# Patient Record
Sex: Female | Born: 1960 | Race: White | Hispanic: No | Smoking: Current some day smoker
Health system: Southern US, Community
[De-identification: ages and names within clinical notes are randomized; demographics above are authoritative.]

## PROBLEM LIST (undated history)

## (undated) DIAGNOSIS — J40 Bronchitis, not specified as acute or chronic: Secondary | ICD-10-CM

## (undated) HISTORY — PX: CHOLECYSTECTOMY: SHX55

---

## 2016-09-01 ENCOUNTER — Emergency Department (HOSPITAL_COMMUNITY): Payer: 59

## 2016-09-01 ENCOUNTER — Encounter (HOSPITAL_COMMUNITY): Payer: Self-pay | Admitting: Emergency Medicine

## 2016-09-01 ENCOUNTER — Emergency Department (HOSPITAL_COMMUNITY)
Admission: EM | Admit: 2016-09-01 | Discharge: 2016-09-01 | Disposition: A | Discharge status: Home / Self Care | Payer: 59 | Attending: Emergency Medicine | Admitting: Emergency Medicine

## 2016-09-01 DIAGNOSIS — Z79899 Other long term (current) drug therapy: Secondary | ICD-10-CM | POA: Diagnosis not present

## 2016-09-01 DIAGNOSIS — J441 Chronic obstructive pulmonary disease with (acute) exacerbation: Secondary | ICD-10-CM | POA: Diagnosis not present

## 2016-09-01 DIAGNOSIS — R9431 Abnormal electrocardiogram [ECG] [EKG]: Secondary | ICD-10-CM | POA: Insufficient documentation

## 2016-09-01 DIAGNOSIS — F17200 Nicotine dependence, unspecified, uncomplicated: Secondary | ICD-10-CM | POA: Diagnosis not present

## 2016-09-01 DIAGNOSIS — R0602 Shortness of breath: Secondary | ICD-10-CM | POA: Diagnosis present

## 2016-09-01 HISTORY — DX: Bronchitis, not specified as acute or chronic: J40

## 2016-09-01 LAB — CBC WITH DIFFERENTIAL/PLATELET
Basophils Absolute: 0 10*3/uL (ref 0.0–0.1)
Basophils Relative: 0 %
EOS PCT: 0 %
Eosinophils Absolute: 0 10*3/uL (ref 0.0–0.7)
HCT: 47.6 % — ABNORMAL HIGH (ref 36.0–46.0)
Hemoglobin: 16.6 g/dL — ABNORMAL HIGH (ref 12.0–15.0)
LYMPHS ABS: 1.7 10*3/uL (ref 0.7–4.0)
LYMPHS PCT: 28 %
MCH: 32.4 pg (ref 26.0–34.0)
MCHC: 34.9 g/dL (ref 30.0–36.0)
MCV: 92.8 fL (ref 78.0–100.0)
MONO ABS: 0.7 10*3/uL (ref 0.1–1.0)
MONOS PCT: 11 %
Neutro Abs: 3.7 10*3/uL (ref 1.7–7.7)
Neutrophils Relative %: 61 %
PLATELETS: 183 10*3/uL (ref 150–400)
RBC: 5.13 MIL/uL — AB (ref 3.87–5.11)
RDW: 13 % (ref 11.5–15.5)
WBC: 6.1 10*3/uL (ref 4.0–10.5)

## 2016-09-01 LAB — BASIC METABOLIC PANEL
Anion gap: 8 (ref 5–15)
BUN: 11 mg/dL (ref 6–20)
CO2: 27 mmol/L (ref 22–32)
Calcium: 9.2 mg/dL (ref 8.9–10.3)
Chloride: 102 mmol/L (ref 101–111)
Creatinine, Ser: 0.9 mg/dL (ref 0.44–1.00)
GFR calc Af Amer: >60 mL/min (ref >60)
GLUCOSE: 90 mg/dL (ref 65–99)
POTASSIUM: 3.8 mmol/L (ref 3.5–5.1)
Sodium: 137 mmol/L (ref 135–145)

## 2016-09-01 LAB — D-DIMER, QUANTITATIVE: D-Dimer, Quant: 1.03 ug/mL-FEU — ABNORMAL HIGH (ref 0.00–0.50)

## 2016-09-01 LAB — TROPONIN I: Troponin I: <0.03 ng/mL (ref <0.03)

## 2016-09-01 MED ORDER — IPRATROPIUM BROMIDE 0.02 % IN SOLN
1.0000 mg | Freq: Once | RESPIRATORY_TRACT | Status: AC
  Administered 2016-09-01: 1 mg via RESPIRATORY_TRACT
  Filled 2016-09-01: qty 5

## 2016-09-01 MED ORDER — ALBUTEROL (5 MG/ML) CONTINUOUS INHALATION SOLN
10.0000 mg/h | INHALATION_SOLUTION | Freq: Once | RESPIRATORY_TRACT | Status: AC
  Administered 2016-09-01: 10 mg/h via RESPIRATORY_TRACT
  Filled 2016-09-01: qty 20

## 2016-09-01 MED ORDER — PREDNISONE 20 MG PO TABS: 40.0000 mg | ORAL_TABLET | Freq: Every day | ORAL | 0 refills | Status: DC

## 2016-09-01 MED ORDER — METHYLPREDNISOLONE SODIUM SUCC 125 MG IJ SOLR
125.0000 mg | Freq: Once | INTRAMUSCULAR | Status: AC
  Administered 2016-09-01: 125 mg via INTRAVENOUS
  Filled 2016-09-01: qty 2

## 2016-09-01 MED ORDER — IOPAMIDOL (ISOVUE-370) INJECTION 76%
100.0000 mL | Freq: Once | INTRAVENOUS | Status: AC | PRN
  Administered 2016-09-01: 100 mL via INTRAVENOUS

## 2016-09-01 NOTE — ED Provider Notes (Signed)
[facility]Y DEPT Provider Note   CSN: [id number] Arrival date & time: 09/01/16  1535     History   Chief Complaint Chief Complaint  Patient presents with  . Shortness of Breath    HPI Cindy Griffith is a 55 y.o. female.  HPI  Pt was seen at 1630. Per pt, c/o gradual onset and worsening of persistent SOB and cough for the past 1 week. Pt states her PMD was "treating me for pneumonia" but "I wasn't getting better," so pt was sent to the ED for further evaluation. Denies fevers, no rash, no CP/palpitations, no back pain, no abd pain, no N/V/D.    Past Medical History:  Diagnosis Date  . Bronchitis     There are no active problems to display for this patient.   Past Surgical History:  Procedure Laterality Date  . CESAREAN SECTION    . CHOLECYSTECTOMY      OB History    No data available       Home Medications    Prior to Admission medications   Not on File    Family History History reviewed. No pertinent family history.  Social History Social History  Substance Use Topics  . Smoking status: Current Every Day Smoker  . Smokeless tobacco: Never Used  . Alcohol use No     Allergies   Codeine   Review of Systems Review of Systems ROS: Statement: All systems negative except as marked or noted in the HPI; Constitutional: Negative for fever and chills. ; ; Eyes: Negative for eye pain, redness and discharge. ; ; ENMT: Negative for ear pain, hoarseness, nasal congestion, sinus pressure and sore throat. ; ; Cardiovascular: Negative for chest pain, palpitations, diaphoresis, and peripheral edema. ; ; Respiratory: +cough, SOB. Negative for wheezing and stridor. ; ; Gastrointestinal: Negative for nausea, vomiting, diarrhea, abdominal pain, blood in stool, hematemesis, jaundice and rectal bleeding. . ; ; Genitourinary: Negative for dysuria, flank pain and hematuria. ; ; Musculoskeletal: Negative for back pain and neck pain. Negative for swelling and trauma.; ; Skin:  Negative for pruritus, rash, abrasions, blisters, bruising and skin lesion.; ; Neuro: Negative for headache, lightheadedness and neck stiffness. Negative for weakness, altered level of consciousness, altered mental status, extremity weakness, paresthesias, involuntary movement, seizure and syncope.     Physical Exam Updated Vital Signs BP (!) 159/115 (BP Location: Left Arm)   Pulse 87   Temp 98.2 F (36.8 C) (Oral)   Resp 22   Ht 5\' 2"  (1.575 m)   Wt 203 lb (92.1 kg)   SpO2 97%   BMI 37.13 kg/m   Physical Exam 1635: Physical examination:  Nursing notes reviewed; Vital signs and O2 SAT reviewed;  Constitutional: Well developed, Well nourished, Well hydrated, In no acute distress; Head:  Normocephalic, atraumatic; Eyes: EOMI, PERRL, No scleral icterus; ENMT: Mouth and pharynx normal, Mucous membranes moist; Neck: Supple, Full range of motion, No lymphadenopathy; Cardiovascular: Regular rate and rhythm, No gallop; Respiratory: Breath sounds coarse & equal bilaterally, insp/exp wheezes. No audible wheezing. Speaking full sentences with ease, Normal respiratory effort/excursion; Chest: Nontender, Movement normal; Abdomen: Soft, Nontender, Nondistended, Normal bowel sounds; Genitourinary: No CVA tenderness; Extremities: Pulses normal, No tenderness, No edema, No calf edema or asymmetry.; Neuro: AA&Ox3, Major CN grossly intact.  Speech clear. No gross focal motor or sensory deficits in extremities.; Skin: Color normal, Warm, Dry.   ED Treatments / Results  Labs (all labs ordered are listed, but only abnormal results are displayed)   EKG  EKG Interpretation  Date/Time:  Monday September 01 2016 15:46:48 EDT Ventricular Rate:  86 PR Interval:  118 QRS Duration: 72 QT Interval:  366 QTC Calculation: 437 R Axis:   50 Text Interpretation:  Normal sinus rhythm ST & T wave abnormality, consider inferior ischemia No old tracing to compare Confirmed by [facility]  MD, Nicholos JohnsKATHLEEN [phone] on 09/01/2016  4:44:38 PM       Radiology   Procedures Procedures (including critical care time)  Medications Ordered in ED Medications  albuterol (PROVENTIL,VENTOLIN) solution continuous neb (not administered)  ipratropium (ATROVENT) nebulizer solution 1 mg (not administered)  methylPREDNISolone sodium succinate (SOLU-MEDROL) 125 mg/2 mL injection 125 mg (125 mg Intravenous Given 09/01/16 1650)     Initial Impression / Assessment and Plan / ED Course  I have reviewed the triage vital signs and the nursing notes.  Pertinent labs & imaging results that were available during my care of the patient were reviewed by me and considered in my medical decision making (see chart for details).  MDM Reviewed: nursing note and vitals Interpretation: labs, ECG and x-ray   Results for orders placed or performed during the hospital encounter of 09/01/16  CBC with Differential  Result Value Ref Range   WBC 6.1 4.0 - 10.5 K/uL   RBC 5.13 (H) 3.87 - 5.11 MIL/uL   Hemoglobin 16.6 (H) 12.0 - 15.0 g/dL   HCT 60.447.6 (H) 54.036.0 - 98.146.0 %   MCV 92.8 78.0 - 100.0 fL   MCH 32.4 26.0 - 34.0 pg   MCHC 34.9 30.0 - 36.0 g/dL   RDW 19.113.0 47.811.5 - 29.515.5 %   Platelets 183 150 - 400 K/uL   Neutrophils Relative % 61 %   Neutro Abs 3.7 1.7 - 7.7 K/uL   Lymphocytes Relative 28 %   Lymphs Abs 1.7 0.7 - 4.0 K/uL   Monocytes Relative 11 %   Monocytes Absolute 0.7 0.1 - 1.0 K/uL   Eosinophils Relative 0 %   Eosinophils Absolute 0.0 0.0 - 0.7 K/uL   Basophils Relative 0 %   Basophils Absolute 0.0 0.0 - 0.1 K/uL  Basic metabolic panel  Result Value Ref Range   Sodium 137 135 - 145 mmol/L   Potassium 3.8 3.5 - 5.1 mmol/L   Chloride 102 101 - 111 mmol/L   CO2 27 22 - 32 mmol/L   Glucose, Bld 90 65 - 99 mg/dL   BUN 11 6 - 20 mg/dL   Creatinine, Ser 6.210.90 0.44 - 1.00 mg/dL   Calcium 9.2 8.9 - 30.810.3 mg/dL   GFR calc non Af Amer >60 >60 mL/min   GFR calc Af Amer >60 >60 mL/min   Anion gap 8 5 - 15  Troponin I  Result Value  Ref Range   Troponin I <0.03 <0.03 ng/mL  D-dimer, quantitative  Result Value Ref Range   D-Dimer, Quant 1.03 (H) 0.00 - 0.50 ug/mL-FEU   Dg Chest 2 View Result Date: 09/01/2016 CLINICAL DATA:  Shortness of breath and productive cough for 1 week EXAM: CHEST  2 VIEW COMPARISON:  None. FINDINGS: The heart size and mediastinal contours are within normal limits. Both lungs are clear. The visualized skeletal structures are unremarkable. IMPRESSION: No active cardiopulmonary disease. Electronically Signed   By: Alcide CleverMark  Lukens M.D.   On: 09/01/2016 16:07   Ct Angio Chest Pe W/cm &/or Wo Cm Result Date: 09/01/2016 CLINICAL DATA:  Shortness of breath for 1 week.  Elevated D-dimer. EXAM: CT ANGIOGRAPHY CHEST WITH CONTRAST TECHNIQUE: Multidetector CT  imaging of the chest was performed using the standard protocol during bolus administration of intravenous contrast. Multiplanar CT image reconstructions and MIPs were obtained to evaluate the vascular anatomy. CONTRAST:  100 mL Isovue 370 COMPARISON:  Chest radiographs earlier today FINDINGS: Cardiovascular: Pulmonary arterial opacification is adequate without evidence of lobar or more central embolus. Evaluation of segmental and subsegmental branches is limited by respiratory motion artifact, particularly in the lower lobes. There is mild thoracic aortic atherosclerosis without aneurysm. The heart is normal in size. Mediastinum/Nodes: No enlarged axillary, mediastinal, or hilar lymph nodes are identified. Thyroid, trachea, and esophagus are unremarkable. Lungs/Pleura: No pleural effusion or pneumothorax. Mild centrilobular emphysema. No lung consolidation or mass. Upper Abdomen: Prior cholecystectomy. 3 cm diverticulum arising from the posterior gastric fundus. 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 Review of the MIP images confirms the above findings. IMPRESSION: 1. No central pulmonary embolus. Limited segmental and subsegmental  evaluation due to respiratory motion artifact. 2. Centrilobular emphysema. 3. Gastric diverticulum. Electronically Signed   By: Sebastian Ache M.D.   On: 09/01/2016 19:17    1940:  Pt states she "feels better" after neb and steroid.  NAD, lungs CTA bilat, no wheezing, resps easy, speaking full sentences, Sats 100% R/A.  Pt ambulated around the ED with Sats remaining 98 % R/A, resps easy, NAD.  Wants to go home now. Denies CP; will need f/u with Cards for abnl EKG. Tx COPD exacerbation (already taking abx). Dx and testing d/w pt and family.  Questions answered.  Verb understanding, agreeable to d/c home with outpt f/u.    Final Clinical Impressions(s) / ED Diagnoses   Final diagnoses:  None    New Prescriptions New Prescriptions   No medications on file     Samuel Jester, DO 09/03/16 2104

## 2016-09-01 NOTE — ED Notes (Signed)
EKG given to Dr Tegeler. 

## 2016-09-01 NOTE — ED Triage Notes (Signed)
[facility] for complaint of shortness of breath x 1 week. States she was treated for possible pneumonia and given antibiotics but is no better.

## 2016-09-01 NOTE — ED Notes (Signed)
    Patient Saturations on Room Air while Ambulating = 98%   

## 2016-09-01 NOTE — Discharge Instructions (Signed)
Take the prescription as directed.  Use your albuterol inhaler (2 to 4 puffs) every 4 hours for the next 7 days, then as needed for cough, wheezing, or shortness of breath.  Call your regular medical doctor tomorrow morning to schedule a follow up appointment within the next 2 days.  Call the Cardiologist tomorrow to schedule a follow up appointment within the next week. Return to the Emergency Department immediately sooner if worsening.

## 2017-02-21 IMAGING — CT CT ANGIO CHEST
2 of 6 series · 18 of 46 positions shown · IV contrast (APPLIED)
Comparison: Chest radiographs earlier today

CLINICAL DATA: Shortness of breath for 1 week.  Elevated D-dimer.

EXAM:
CT ANGIOGRAPHY CHEST WITH CONTRAST
TECHNIQUE: Multidetector CT imaging of the chest was performed using the
standard protocol during bolus administration of intravenous
contrast. Multiplanar CT image reconstructions and MIPs were
obtained to evaluate the vascular anatomy.
CONTRAST:  100 mL Isovue 370

[Series 5: thins · axial · 0.72mm/px · z∈[+1280,+1529]mm · 15 of 273 slices shown]
[im 12/273  lung]
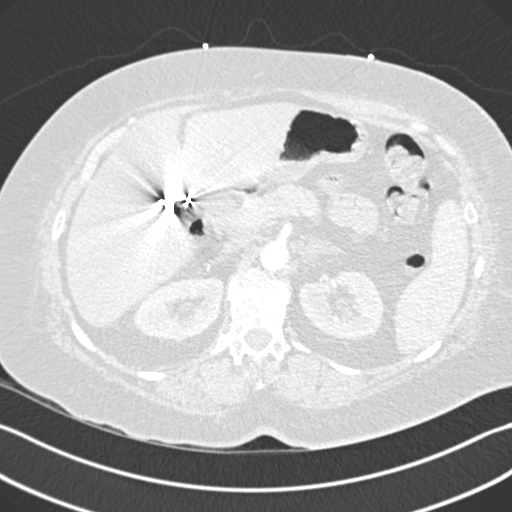
[im 36/273  soft-tissue]
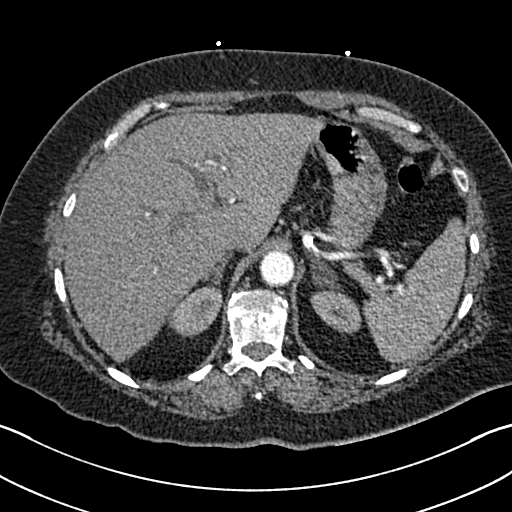
[im 48/273  lung]
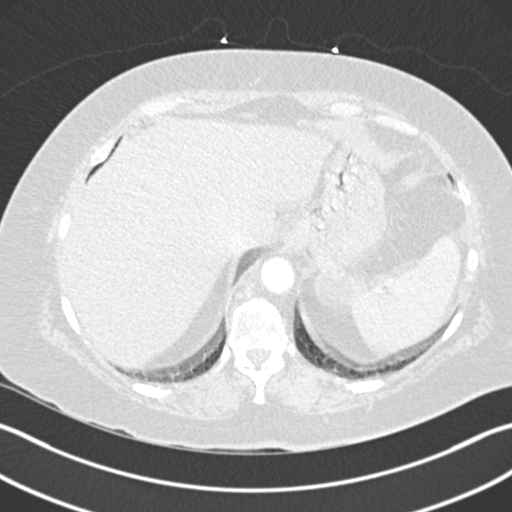
[im 71/273  soft-tissue]
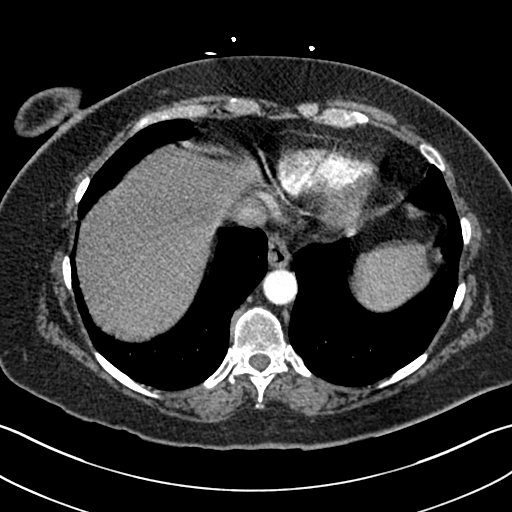
[im 83/273  lung]
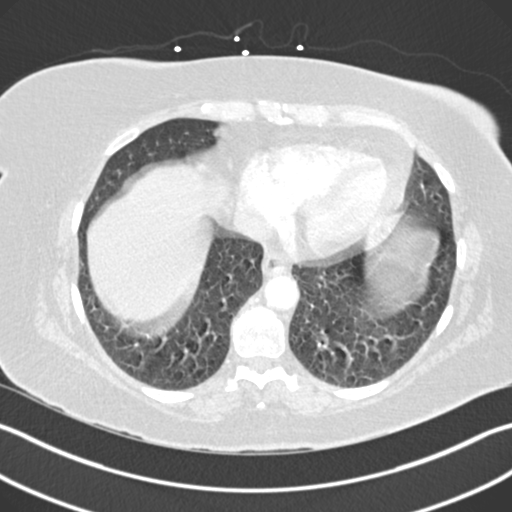
[im 107/273  soft-tissue]
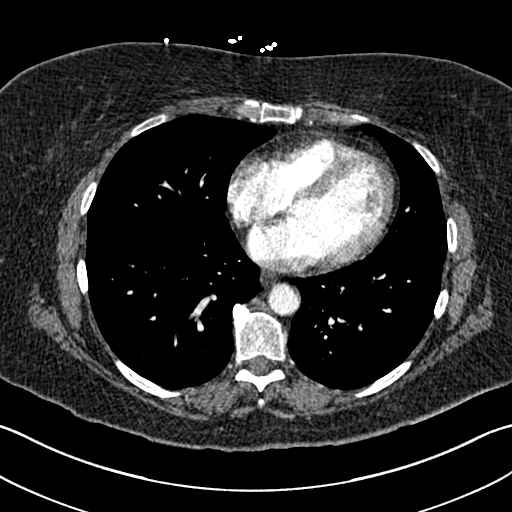
[im 119/273  lung]
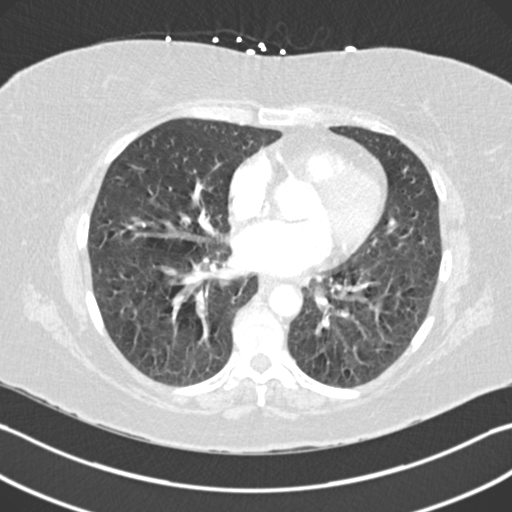
[im 142/273  soft-tissue]
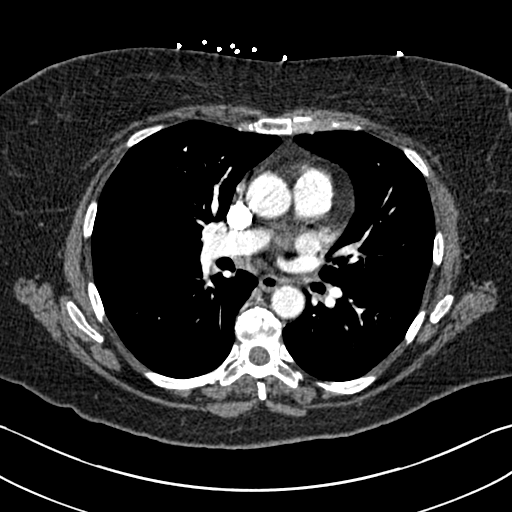
[im 154/273  lung]
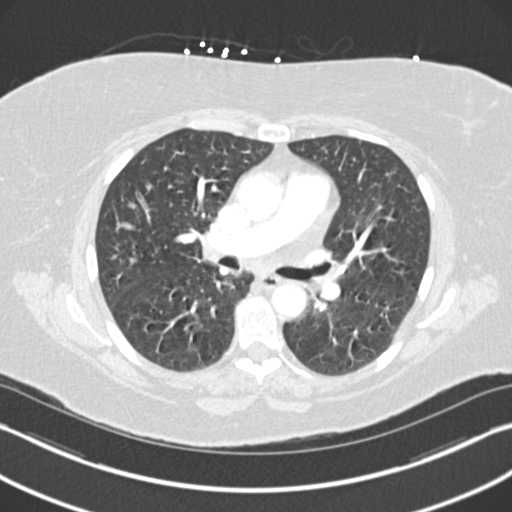
[im 166/273  soft-tissue]
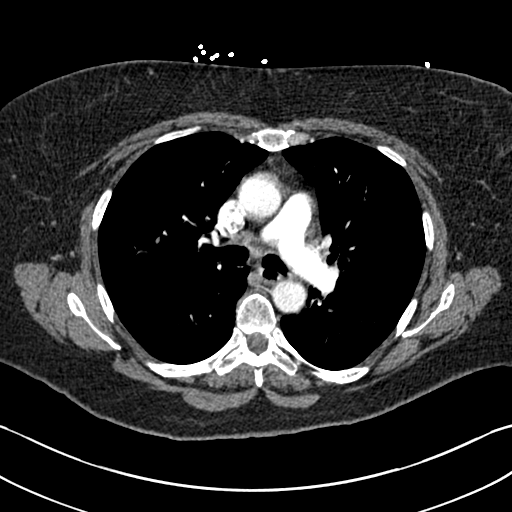
[im 190/273  lung]
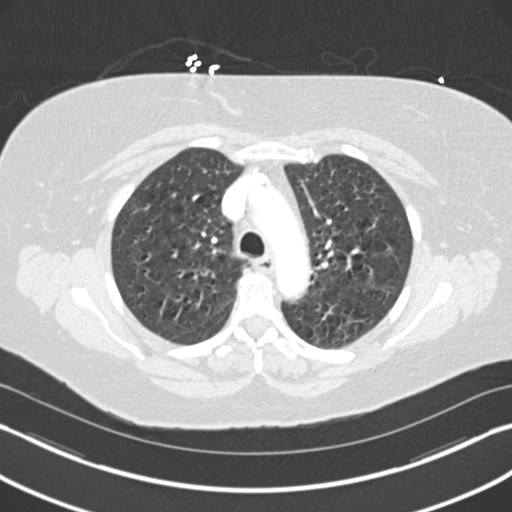
[im 202/273  soft-tissue]
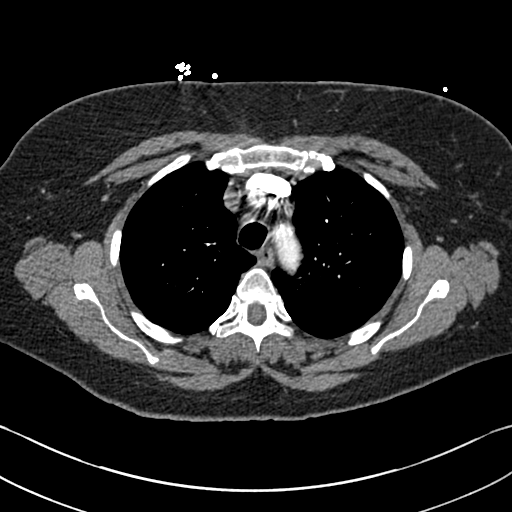
[im 225/273  lung]
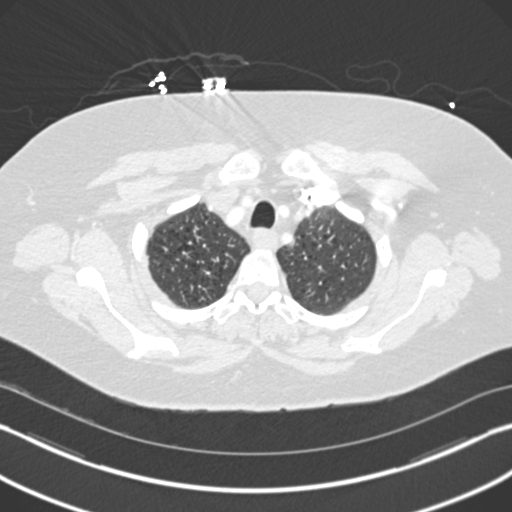
[im 237/273  soft-tissue]
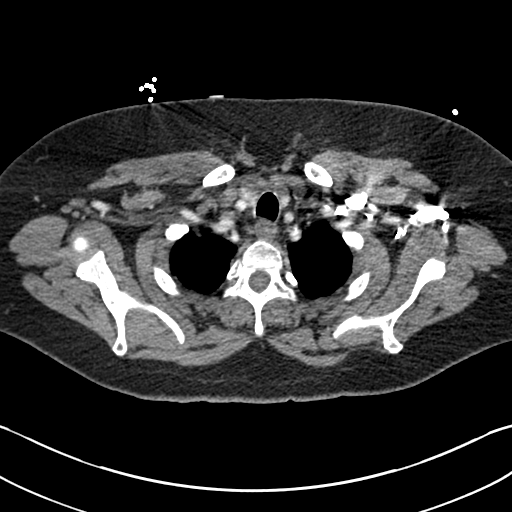
[im 261/273  lung]
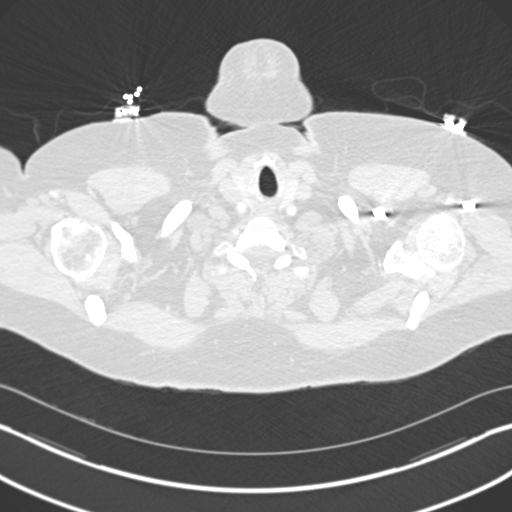

[Series 7: coronal mpr · coronal · 0.56mm/px · 3 of 151 slices shown]
[im 38/151  soft-tissue]
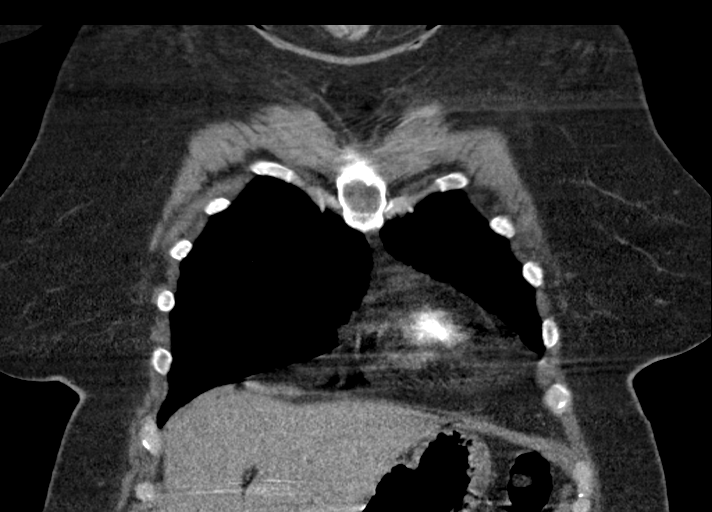
[im 76/151  soft-tissue]
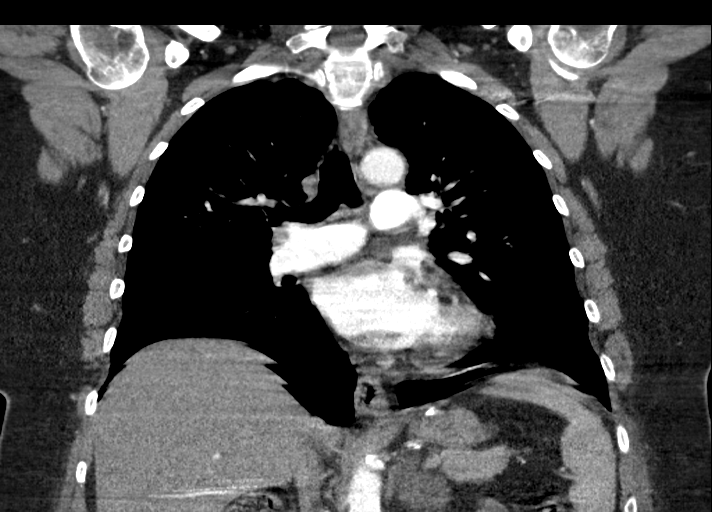
[im 113/151  soft-tissue]
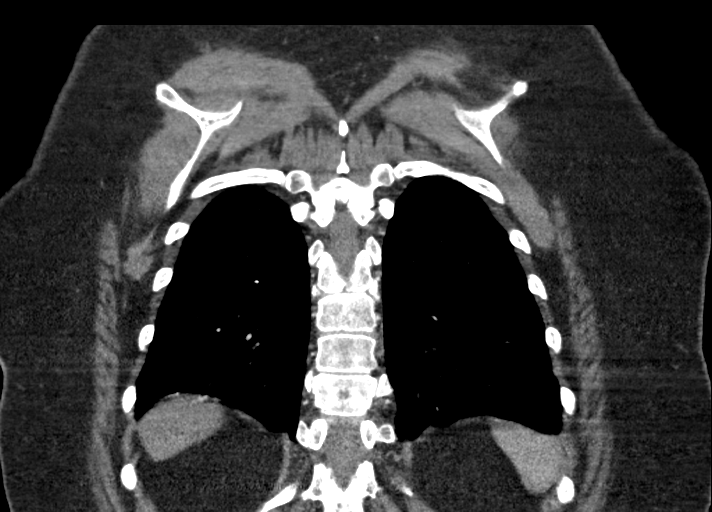

[18 of 46 positions shown; findings below may reference images not displayed]

FINDINGS: Cardiovascular: Pulmonary arterial opacification is adequate without
evidence of lobar or more central embolus. Evaluation of segmental
and subsegmental branches is limited by respiratory motion artifact,
particularly in the lower lobes. There is mild thoracic aortic
atherosclerosis without aneurysm. The heart is normal in size.

Mediastinum/Nodes: No enlarged axillary, mediastinal, or hilar lymph
nodes are identified. Thyroid, trachea, and esophagus are
unremarkable.

Lungs/Pleura: No pleural effusion or pneumothorax. Mild
centrilobular emphysema. No lung consolidation or mass.

Upper Abdomen: Prior cholecystectomy. 3 cm diverticulum arising from
the posterior gastric fundus.

Musculoskeletal: Multilevel thoracic disc degeneration and Schmorl's
nodes.

Review of the MIP images confirms the above findings.
IMPRESSION: 1. No central pulmonary embolus. Limited segmental and subsegmental
evaluation due to respiratory motion artifact.
2. Centrilobular emphysema.
3. Gastric diverticulum.

## 2023-04-07 ENCOUNTER — Observation Stay (HOSPITAL_COMMUNITY)
Admission: EM | Admit: 2023-04-07 | Discharge: 2023-04-08 | Disposition: A | Discharge status: Home / Self Care | Payer: Medicaid Other | Attending: Family Medicine | Admitting: Family Medicine

## 2023-04-07 ENCOUNTER — Encounter (HOSPITAL_COMMUNITY): Payer: Self-pay | Admitting: Emergency Medicine

## 2023-04-07 ENCOUNTER — Emergency Department (HOSPITAL_COMMUNITY): Payer: Medicaid Other

## 2023-04-07 ENCOUNTER — Other Ambulatory Visit: Payer: Self-pay

## 2023-04-07 DIAGNOSIS — R0602 Shortness of breath: Secondary | ICD-10-CM | POA: Diagnosis present

## 2023-04-07 DIAGNOSIS — F17210 Nicotine dependence, cigarettes, uncomplicated: Secondary | ICD-10-CM | POA: Insufficient documentation

## 2023-04-07 DIAGNOSIS — J9601 Acute respiratory failure with hypoxia: Secondary | ICD-10-CM | POA: Diagnosis not present

## 2023-04-07 DIAGNOSIS — J45909 Unspecified asthma, uncomplicated: Secondary | ICD-10-CM | POA: Diagnosis present

## 2023-04-07 DIAGNOSIS — D751 Secondary polycythemia: Secondary | ICD-10-CM | POA: Diagnosis present

## 2023-04-07 DIAGNOSIS — Z1152 Encounter for screening for COVID-19: Secondary | ICD-10-CM | POA: Diagnosis not present

## 2023-04-07 DIAGNOSIS — F17200 Nicotine dependence, unspecified, uncomplicated: Secondary | ICD-10-CM | POA: Diagnosis not present

## 2023-04-07 DIAGNOSIS — E876 Hypokalemia: Secondary | ICD-10-CM | POA: Diagnosis not present

## 2023-04-07 DIAGNOSIS — R062 Wheezing: Secondary | ICD-10-CM | POA: Diagnosis not present

## 2023-04-07 DIAGNOSIS — J42 Unspecified chronic bronchitis: Secondary | ICD-10-CM | POA: Diagnosis present

## 2023-04-07 DIAGNOSIS — R059 Cough, unspecified: Secondary | ICD-10-CM | POA: Diagnosis present

## 2023-04-07 DIAGNOSIS — J4541 Moderate persistent asthma with (acute) exacerbation: Secondary | ICD-10-CM

## 2023-04-07 DIAGNOSIS — J441 Chronic obstructive pulmonary disease with (acute) exacerbation: Secondary | ICD-10-CM | POA: Diagnosis not present

## 2023-04-07 DIAGNOSIS — R000 Tachycardia, unspecified: Secondary | ICD-10-CM | POA: Diagnosis not present

## 2023-04-07 DIAGNOSIS — D72829 Elevated white blood cell count, unspecified: Secondary | ICD-10-CM | POA: Diagnosis not present

## 2023-04-07 DIAGNOSIS — J449 Chronic obstructive pulmonary disease, unspecified: Secondary | ICD-10-CM | POA: Diagnosis present

## 2023-04-07 DIAGNOSIS — R0989 Other specified symptoms and signs involving the circulatory and respiratory systems: Secondary | ICD-10-CM | POA: Diagnosis present

## 2023-04-07 LAB — RESP PANEL BY RT-PCR (RSV, FLU A&B, COVID)  RVPGX2
Influenza A by PCR: NEGATIVE
Influenza B by PCR: NEGATIVE
Resp Syncytial Virus by PCR: NEGATIVE
SARS Coronavirus 2 by RT PCR: NEGATIVE

## 2023-04-07 LAB — CBC WITH DIFFERENTIAL/PLATELET
Abs Immature Granulocytes: 0.04 10*3/uL (ref 0.00–0.07)
Basophils Absolute: 0 10*3/uL (ref 0.0–0.1)
Basophils Relative: 0 %
Eosinophils Absolute: 0 10*3/uL (ref 0.0–0.5)
Eosinophils Relative: 0 %
HCT: 46.6 % — ABNORMAL HIGH (ref 36.0–46.0)
Hemoglobin: 15.7 g/dL — ABNORMAL HIGH (ref 12.0–15.0)
Immature Granulocytes: 0 %
Lymphocytes Relative: 5 %
Lymphs Abs: 0.6 10*3/uL — ABNORMAL LOW (ref 0.7–4.0)
MCH: 31.5 pg (ref 26.0–34.0)
MCHC: 33.7 g/dL (ref 30.0–36.0)
MCV: 93.6 fL (ref 80.0–100.0)
Monocytes Absolute: 0.4 10*3/uL (ref 0.1–1.0)
Monocytes Relative: 3 %
Neutro Abs: 11.8 10*3/uL — ABNORMAL HIGH (ref 1.7–7.7)
Neutrophils Relative %: 92 %
Platelets: 161 10*3/uL (ref 150–400)
RBC: 4.98 MIL/uL (ref 3.87–5.11)
RDW: 13.1 % (ref 11.5–15.5)
WBC: 12.8 10*3/uL — ABNORMAL HIGH (ref 4.0–10.5)
nRBC: 0 % (ref 0.0–0.2)

## 2023-04-07 LAB — COMPREHENSIVE METABOLIC PANEL
ALT: 20 U/L (ref 0–44)
AST: 25 U/L (ref 15–41)
Albumin: 3.8 g/dL (ref 3.5–5.0)
Alkaline Phosphatase: 70 U/L (ref 38–126)
Anion gap: 19 — ABNORMAL HIGH (ref 5–15)
BUN: 14 mg/dL (ref 8–23)
CO2: 19 mmol/L — ABNORMAL LOW (ref 22–32)
Calcium: 9 mg/dL (ref 8.9–10.3)
Chloride: 97 mmol/L — ABNORMAL LOW (ref 98–111)
Creatinine, Ser: 0.82 mg/dL (ref 0.44–1.00)
GFR, Estimated: >60 mL/min (ref >60)
Glucose, Bld: 109 mg/dL — ABNORMAL HIGH (ref 70–99)
Potassium: 3.3 mmol/L — ABNORMAL LOW (ref 3.5–5.1)
Sodium: 135 mmol/L (ref 135–145)
Total Bilirubin: 1.5 mg/dL — ABNORMAL HIGH (ref 0.3–1.2)
Total Protein: 7.6 g/dL (ref 6.5–8.1)

## 2023-04-07 LAB — LACTIC ACID, PLASMA
Lactic Acid, Venous: 1.6 mmol/L (ref 0.5–1.9)
Lactic Acid, Venous: 2.1 mmol/L (ref 0.5–1.9)

## 2023-04-07 LAB — MAGNESIUM: Magnesium: 2.1 mg/dL (ref 1.7–2.4)

## 2023-04-07 LAB — CULTURE, BLOOD (ROUTINE X 2)

## 2023-04-07 LAB — PROTIME-INR
INR: 1.1 (ref 0.8–1.2)
Prothrombin Time: 14.6 seconds (ref 11.4–15.2)

## 2023-04-07 MED ORDER — IPRATROPIUM-ALBUTEROL 0.5-2.5 (3) MG/3ML IN SOLN: 3.0000 mL | RESPIRATORY_TRACT | Status: DC | PRN

## 2023-04-07 MED ORDER — DOXYCYCLINE HYCLATE 100 MG PO TABS
100.0000 mg | ORAL_TABLET | Freq: Two times a day (BID) | ORAL | Status: DC
  Administered 2023-04-08: 100 mg via ORAL
  Filled 2023-04-07: qty 1

## 2023-04-07 MED ORDER — IPRATROPIUM-ALBUTEROL 0.5-2.5 (3) MG/3ML IN SOLN
3.0000 mL | Freq: Three times a day (TID) | RESPIRATORY_TRACT | Status: DC
  Administered 2023-04-08: 3 mL via RESPIRATORY_TRACT
  Filled 2023-04-07: qty 3

## 2023-04-07 MED ORDER — ENOXAPARIN SODIUM 40 MG/0.4ML IJ SOSY
40.0000 mg | PREFILLED_SYRINGE | INTRAMUSCULAR | Status: DC
  Administered 2023-04-07: 40 mg via SUBCUTANEOUS
  Filled 2023-04-07: qty 0.4

## 2023-04-07 MED ORDER — METHYLPREDNISOLONE SODIUM SUCC 40 MG IJ SOLR: 40.0000 mg | Freq: Two times a day (BID) | INTRAMUSCULAR | Status: DC

## 2023-04-07 MED ORDER — NICOTINE 14 MG/24HR TD PT24
14.0000 mg | MEDICATED_PATCH | Freq: Every day | TRANSDERMAL | Status: DC | PRN
  Administered 2023-04-07: 14 mg via TRANSDERMAL
  Filled 2023-04-07: qty 1

## 2023-04-07 MED ORDER — SODIUM CHLORIDE 0.9 % IV SOLN
1.0000 g | Freq: Once | INTRAVENOUS | Status: AC
  Administered 2023-04-07: 1 g via INTRAVENOUS
  Filled 2023-04-07: qty 10

## 2023-04-07 MED ORDER — PNEUMOCOCCAL 20-VAL CONJ VACC 0.5 ML IM SUSY: 0.5000 mL | PREFILLED_SYRINGE | INTRAMUSCULAR | Status: DC | PRN

## 2023-04-07 MED ORDER — ALBUTEROL SULFATE (2.5 MG/3ML) 0.083% IN NEBU
10.0000 mg | INHALATION_SOLUTION | Freq: Once | RESPIRATORY_TRACT | Status: AC
  Administered 2023-04-07: 10 mg via RESPIRATORY_TRACT
  Filled 2023-04-07: qty 12

## 2023-04-07 MED ORDER — IPRATROPIUM-ALBUTEROL 0.5-2.5 (3) MG/3ML IN SOLN
3.0000 mL | RESPIRATORY_TRACT | Status: DC
  Administered 2023-04-07: 3 mL via RESPIRATORY_TRACT
  Filled 2023-04-07: qty 3

## 2023-04-07 MED ORDER — PANTOPRAZOLE SODIUM 40 MG PO TBEC
40.0000 mg | DELAYED_RELEASE_TABLET | Freq: Every day | ORAL | Status: DC
  Administered 2023-04-07 – 2023-04-08 (×2): 40 mg via ORAL
  Filled 2023-04-07 (×2): qty 1

## 2023-04-07 MED ORDER — SODIUM CHLORIDE 0.9 % IV SOLN
500.0000 mg | Freq: Once | INTRAVENOUS | Status: AC
  Administered 2023-04-07: 500 mg via INTRAVENOUS
  Filled 2023-04-07: qty 5

## 2023-04-07 MED ORDER — DEXTROMETHORPHAN POLISTIREX ER 30 MG/5ML PO SUER
30.0000 mg | Freq: Two times a day (BID) | ORAL | Status: DC | PRN
  Administered 2023-04-07: 30 mg via ORAL
  Filled 2023-04-07: qty 5

## 2023-04-07 MED ORDER — POTASSIUM CHLORIDE CRYS ER 20 MEQ PO TBCR
40.0000 meq | EXTENDED_RELEASE_TABLET | Freq: Once | ORAL | Status: AC
  Administered 2023-04-07: 40 meq via ORAL
  Filled 2023-04-07: qty 2

## 2023-04-07 MED ORDER — GUAIFENESIN ER 600 MG PO TB12
1200.0000 mg | ORAL_TABLET | Freq: Two times a day (BID) | ORAL | Status: DC
  Administered 2023-04-07 – 2023-04-08 (×2): 1200 mg via ORAL
  Filled 2023-04-07 (×2): qty 2

## 2023-04-07 MED ORDER — LACTATED RINGERS IV BOLUS
500.0000 mL | Freq: Once | INTRAVENOUS | Status: AC
  Administered 2023-04-07: 500 mL via INTRAVENOUS

## 2023-04-07 MED ORDER — TRAZODONE HCL 50 MG PO TABS: 50.0000 mg | ORAL_TABLET | Freq: Every evening | ORAL | Status: DC | PRN

## 2023-04-07 MED ORDER — METHYLPREDNISOLONE SODIUM SUCC 40 MG IJ SOLR
40.0000 mg | Freq: Two times a day (BID) | INTRAMUSCULAR | Status: DC
  Administered 2023-04-07 – 2023-04-08 (×2): 40 mg via INTRAVENOUS
  Filled 2023-04-07 (×2): qty 1

## 2023-04-07 NOTE — ED Notes (Signed)
Pt SPO2 88% while ambulating.

## 2023-04-07 NOTE — ED Provider Notes (Signed)
[place] EMERGENCY DEPARTMENT AT [facility] Provider Note   CSN: [id number] Arrival date & time: 04/07/23  1002     History  Chief Complaint  Patient presents with   Shortness of Breath    Cindy Griffith is a 62 y.o. female.   Shortness of Breath Associated symptoms: cough and wheezing   Patient presents for shortness of breath.  She has no known medical history.  She denies ever being told that she has COPD.  She does have a long smoking history.  She did continue to smoke up until 6 days ago.  Over the past 5 days, she has had progressive shortness of breath.  She has had a cough productive of thick sputum.  She was seen urgent care this morning.  She was given steroids and 2 nebulized breathing treatments.  She did have improvement in her symptoms after breathing treatments.  She continues to feel short of breath.  At urgent care, SpO2 on room air was 91%.  She was sent to the ED for further evaluation.     Home Medications Prior to Admission medications   Not on File      Allergies    Codeine    Review of Systems   Review of Systems  Constitutional:  Positive for fatigue.  Respiratory:  Positive for cough, chest tightness, shortness of breath and wheezing.   All other systems reviewed and are negative.   Physical Exam Updated Vital Signs BP 124/77 (BP Location: Right Arm)   Pulse (!) 101   Temp 98 F (36.7 C) (Oral)   Resp (!) 22   SpO2 96%  Physical Exam Vitals and nursing note reviewed.  Constitutional:      General: She is not in acute distress.    Appearance: She is well-developed. She is not ill-appearing, toxic-appearing or diaphoretic.  HENT:     Head: Normocephalic and atraumatic.     Mouth/Throat:     Mouth: Mucous membranes are moist.  Eyes:     Conjunctiva/sclera: Conjunctivae normal.  Cardiovascular:     Rate and Rhythm: Regular rhythm. Tachycardia present.     Heart sounds: No murmur heard. Pulmonary:     Effort: Pulmonary effort  is normal. No respiratory distress.     Breath sounds: Wheezing present. No rhonchi or rales.  Chest:     Chest wall: No tenderness.  Abdominal:     Palpations: Abdomen is soft.     Tenderness: There is no abdominal tenderness.  Musculoskeletal:        General: No swelling. Normal range of motion.     Cervical back: Normal range of motion and neck supple.     Right lower leg: No edema.     Left lower leg: No edema.  Skin:    General: Skin is warm and dry.     Coloration: Skin is not cyanotic or pale.  Neurological:     General: No focal deficit present.     Mental Status: She is alert and oriented to person, place, and time.  Psychiatric:        Mood and Affect: Mood normal.        Behavior: Behavior normal.     ED Results / Procedures / Treatments   Labs (all labs ordered are listed, but only abnormal results are displayed) Labs Reviewed  COMPREHENSIVE METABOLIC PANEL - Abnormal; Notable for the following components:      Result Value   Potassium 3.3 (*)  Chloride 97 (*)    CO2 19 (*)    Glucose, Bld 109 (*)    Total Bilirubin 1.5 (*)    Anion gap 19 (*)    All other components within normal limits  LACTIC ACID, PLASMA - Abnormal; Notable for the following components:   Lactic Acid, Venous 2.1 (*)    All other components within normal limits  CBC WITH DIFFERENTIAL/PLATELET - Abnormal; Notable for the following components:   WBC 12.8 (*)    Hemoglobin 15.7 (*)    HCT 46.6 (*)    Neutro Abs 11.8 (*)    Lymphs Abs 0.6 (*)    All other components within normal limits  CULTURE, BLOOD (ROUTINE X 2)  CULTURE, BLOOD (ROUTINE X 2)  RESP PANEL BY RT-PCR (RSV, FLU A&B, COVID)  RVPGX2  LACTIC ACID, PLASMA  PROTIME-INR  MAGNESIUM  HIV ANTIBODY (ROUTINE TESTING W REFLEX)  MAGNESIUM  BASIC METABOLIC PANEL  CBC WITH DIFFERENTIAL/PLATELET    EKG None  Radiology DG Chest 2 View  Result Date: 04/07/2023 CLINICAL DATA:  Shortness of breath over the last 5 days. Poss  small sepsis. EXAM: CHEST - 2 VIEW COMPARISON:  09/01/2016 FINDINGS: Widespread bronchial thickening suggesting bronchitis. No consolidation, collapse or effusion. No abnormal bone finding. IMPRESSION: Bronchitis pattern. No consolidation or collapse. Electronically Signed   By: Paulina Fusi M.D.   On: 04/07/2023 11:45    Procedures Procedures    Medications Ordered in ED Medications  enoxaparin (LOVENOX) injection 40 mg (40 mg Subcutaneous Given 04/07/23 1643)  ipratropium-albuterol (DUONEB) 0.5-2.5 (3) MG/3ML nebulizer solution 3 mL (has no administration in time range)  ipratropium-albuterol (DUONEB) 0.5-2.5 (3) MG/3ML nebulizer solution 3 mL (has no administration in time range)  dextromethorphan (DELSYM) 30 MG/5ML liquid 30 mg (has no administration in time range)  doxycycline (VIBRA-TABS) tablet 100 mg (has no administration in time range)  guaiFENesin (MUCINEX) 12 hr tablet 1,200 mg (1,200 mg Oral Given 04/07/23 1643)  traZODone (DESYREL) tablet 50 mg (has no administration in time range)  pneumococcal 20-valent conjugate vaccine (PREVNAR 20) injection 0.5 mL (has no administration in time range)  methylPREDNISolone sodium succinate (SOLU-MEDROL) 40 mg/mL injection 40 mg (has no administration in time range)  nicotine (NICODERM CQ - dosed in mg/24 hours) patch 14 mg (14 mg Transdermal Patch Applied 04/07/23 1643)  pantoprazole (PROTONIX) EC tablet 40 mg (40 mg Oral Given 04/07/23 1650)  albuterol (PROVENTIL) (2.5 MG/3ML) 0.083% nebulizer solution 10 mg (10 mg Nebulization Given 04/07/23 1224)  cefTRIAXone (ROCEPHIN) 1 g in sodium chloride 0.9 % 100 mL IVPB (0 g Intravenous Stopped 04/07/23 1245)  azithromycin (ZITHROMAX) 500 mg in sodium chloride 0.9 % 250 mL IVPB (0 mg Intravenous Stopped 04/07/23 1349)  potassium chloride SA (KLOR-CON M) CR tablet 40 mEq (40 mEq Oral Given 04/07/23 1643)  lactated ringers bolus 500 mL (500 mLs Intravenous New Bag/Given 04/07/23 1647)    ED Course/ Medical  Decision Making/ A&P Clinical Course as of 04/07/23 1743  Tue Apr 07, 2023  1501 Basophils Absolute: 0.0 [MB]    Clinical Course User Index [MB] Terrilee Files, MD                             Medical Decision Making Amount and/or Complexity of Data Reviewed Labs: ordered. Radiology: ordered.  Risk Prescription drug management. Decision regarding hospitalization.   This patient presents to the ED for concern of shortness of breath, this involves an  extensive number of treatment options, and is a complaint that carries with it a high risk of complications and morbidity.  The differential diagnosis includes pneumonia, COPD exacerbation, CHF, allergic reaction, anemia   Co morbidities that complicate the patient evaluation  Smoking history   Additional history obtained:  Additional history obtained from N/A External records from outside source obtained and reviewed including EMR   Lab Tests:  I Ordered, and personally interpreted labs.  The pertinent results include: Leukocytosis is present.  Hemoglobin is hide-normal.  Anion gap metabolic acidosis is present.  Mild hypokalemia is present.   Imaging Studies ordered:  I ordered imaging studies including chest x-ray I independently visualized and interpreted imaging which showed bronchitis I agree with the radiologist interpretation   Cardiac Monitoring: / EKG:  The patient was maintained on a cardiac monitor.  I personally viewed and interpreted the cardiac monitored which showed an underlying rhythm of: Sinus rhythm  Problem List / ED Course / Critical interventions / Medication management  Patient presents for cough and shortness of breath that has been progressive over the past 5 days.  She had improved symptoms after breathing treatments that were given at urgent care.  On arrival in the ED, patient is awake and alert.  Breathing is unlabored but she is mildly tachypneic.  She is able to speak in complete  sentences.  She has diffuse wheezing on lung auscultation.  I suspect undiagnosed COPD.  Additional albuterol was ordered.  Lab work and x-ray were notable for leukocytosis.  X-ray shows no focal opacities.  After continuous albuterol, patient had continued improvement in symptoms.  Wheezing on lung auscultation was improved.  With trial of ambulation, patient had drop in SpO2 to 88%.  She was placed on nasal cannula.  She was admitted for further management. I ordered medication including albuterol, ceftriaxone, azithromycin for COPD exacerbation; potassium chloride for hypokalemia; IVF for hydration Reevaluation of the patient after these medicines showed that the patient improved I have reviewed the patients home medicines and have made adjustments as needed   Social Determinants of Health:  Lives independently         Final Clinical Impression(s) / ED Diagnoses Final diagnoses:  COPD exacerbation (HCC)  Acute respiratory failure with hypoxia [facility])    Rx / DC Orders ED Discharge Orders     None         Gloris Manchester, MD 04/07/23 1743

## 2023-04-07 NOTE — ED Notes (Signed)
Date and time results received: 04/07/23 1503 (use smartphrase ".now" to insert current time)  Test: lactic acid Critical Value: 2.1  Name of Provider Notified: dr Laural Benes  Orders Received? Or Actions Taken?: Orders Received - See Orders for details

## 2023-04-07 NOTE — Hospital Course (Addendum)
62 year old female long-time smoker with not a lot of prior medical history and on no regular medications had been traveling to [place] to visit family when she started developing cough, wheezing and shortness of breath about 5 days ago.  She ended up using her grand-daughter's albuterol nebulizer when she started having a severe wheezing and shortness of breath episode.  That seemed to relieve her symptoms so that she was able to fly back home.  She waiting until Tues 5/28 to go to her trusted urgent care.  They treated her with an IM steroid injection and nebulizer treatment and sent her to the ED with concern for new oxygen requirement.  She was given additional nebulizer treatment in the ED and she started to show some improvement however when she was ambulated her oxygen saturation dipped to 88% on room air and she became tachycardic and short of breath.  Admission was requested for a longer course of treatment.  Pt says that she has had "bronchitis" multiple times in the past but has never been this severe and she has never required hospitalization for this in the past.  Her chest xray shows bronchitis findings.  With her long smoking history she is being treated for presumptive acute exacerbation of COPD. She was given a dose of ceftriaxone and azithromycin in the ED in addition to a continuous hour-long albuterol nebulizer treatment.

## 2023-04-07 NOTE — Progress Notes (Signed)
   04/07/23 1536  Assess: MEWS Score  Temp 98 F (36.7 C)  BP 124/77  MAP (mmHg) 91  Pulse Rate (!) 101  Resp (!) 22  Level of Consciousness Alert  SpO2 96 %  O2 Device Nasal Cannula  O2 Flow Rate (L/min) 2 L/min  Assess: MEWS Score  MEWS Temp 0  MEWS Systolic 0  MEWS Pulse 1  MEWS RR 1  MEWS LOC 0  MEWS Score 2  MEWS Score Color Yellow  Assess: if the MEWS score is Yellow or Red  Were vital signs taken at a resting state? Yes  Focused Assessment No change from prior assessment  Does the patient meet 2 or more of the SIRS criteria? No  MEWS guidelines implemented  Yes, yellow  Treat  MEWS Interventions Considered administering scheduled or prn medications/treatments as ordered  Take Vital Signs  Increase Vital Sign Frequency  Yellow: Q2hr x1, continue Q4hrs until patient remains green for 12hrs  Escalate  MEWS: Escalate Yellow: Discuss with charge nurse and consider notifying provider and/or RRT  Notify: Charge Nurse/RN  Name of Charge Nurse/RN Notified Morrie Sheldon, RN  Assess: SIRS CRITERIA  SIRS Temperature  0  SIRS Pulse 1  SIRS Respirations  1  SIRS WBC 1  SIRS Score Sum  3   MEWS yellow due to RR 22. Increased RR due to copd exacerbation.

## 2023-04-07 NOTE — ED Notes (Signed)
Placed on 2l [place] 02

## 2023-04-07 NOTE — ED Notes (Signed)
Pt did not have 02 on when in room. Pt states someone took it off and told her she did not need it. Pt sats 93% ra at this time and nad.

## 2023-04-07 NOTE — ED Triage Notes (Signed)
Pt c/o sob x 5 days. States has progressively gotten worse. Congested cough noted, pt states it is thick and can't get it up most of the time. No ble swelling noted. A/o. Color wnl. Mild shob noted with some auditory wheezing. Denies pain. Was given 2 breathing tx this am at urgent care.

## 2023-04-07 NOTE — H&P (Signed)
History and Physical  [facility]  Cindy Griffith ZOX:[id number] DOB: 1961-07-25 DOA: 04/07/2023  PCP: [facility]  Patient coming from: sent from urgent care to ED Level of care: Med-Surg  I have personally briefly reviewed patient's old medical records in [facility] Link  Chief Complaint: SOB  HPI: Cindy Griffith is a 62 year old female long-time smoker with not a lot of prior medical history and on no regular medications had been traveling to [place] to visit family when she started developing cough, wheezing and shortness of breath about 5 days ago.  She ended up using her grand-daughter's albuterol nebulizer when she started having a severe wheezing and shortness of breath episode.  That seemed to relieve her symptoms so that she was able to fly back home.  She waiting until Tues 5/28 to go to her trusted urgent care.  They treated her with an IM steroid injection and nebulizer treatment and sent her to the ED with concern for new oxygen requirement.  She was given additional nebulizer treatment in the ED and she started to show some improvement however when she was ambulated her oxygen saturation dipped to 88% on room air and she became tachycardic and short of breath.  Admission was requested for a longer course of treatment.  Pt says that she has had "bronchitis" multiple times in the past but has never been this severe and she has never required hospitalization for this in the past.  Her chest xray shows bronchitis findings.  With her long smoking history she is being treated for presumptive acute exacerbation of COPD. She was given a dose of ceftriaxone and azithromycin in the ED in addition to a continuous hour-long albuterol nebulizer treatment.     Past Medical History:  Diagnosis Date   Bronchitis     Past Surgical History:  Procedure Laterality Date   CESAREAN SECTION     CHOLECYSTECTOMY       reports that she has been smoking cigarettes. She has  never used smokeless tobacco. She reports that she does not drink alcohol and does not use drugs.  Allergies  Allergen Reactions   Codeine Nausea And Vomiting    History reviewed. No pertinent family history.  Prior to Admission medications   Not on File    Physical Exam: Vitals:   04/07/23 1444 04/07/23 1446 04/07/23 1500 04/07/23 1536  BP:   119/73 124/77  Pulse: (!) 108 (!) 110 (!) 108 (!) 101  Resp: (!) 25 (!) 30 (!) 22 (!) 22  Temp:    98 F (36.7 C)  TempSrc:    Oral  SpO2: 92% 92% 93% 96%    Constitutional: NAD, calm, comfortable Eyes: PERRL, lids and conjunctivae normal ENMT: Mucous membranes are moist. Posterior pharynx clear of any exudate or lesions.Normal dentition.  Neck: normal, supple, no masses, no thyromegaly Respiratory: diffuse bilateral expiratory wheezing, no crackles. Mild increased work of breathing.  Cardiovascular: normal s1, s2 sounds, no murmurs / rubs / gallops. No extremity edema. 2+ pedal pulses. No carotid bruits.  Abdomen: no tenderness, no masses palpated. No hepatosplenomegaly. Bowel sounds positive.  Musculoskeletal: no clubbing / cyanosis. No joint deformity upper and lower extremities. Good ROM, no contractures. Normal muscle tone.  Skin: no rashes, lesions, ulcers. No induration Neurologic: CN 2-12 grossly intact. Sensation intact, DTR normal. Strength 5/5 in all 4.  Psychiatric: Normal judgment and insight. Alert and oriented x 3. Normal mood.   Labs on Admission: I have  personally reviewed following labs and imaging studies  CBC: Recent Labs  Lab 04/07/23 1145  WBC 12.8*  NEUTROABS 11.8*  HGB 15.7*  HCT 46.6*  MCV 93.6  PLT 161   Basic Metabolic Panel: Recent Labs  Lab 04/07/23 1145  NA 135  K 3.3*  CL 97*  CO2 19*  GLUCOSE 109*  BUN 14  CREATININE 0.82  CALCIUM 9.0   GFR: CrCl cannot be calculated (Unknown ideal weight.). Liver Function Tests: Recent Labs  Lab 04/07/23 1145  AST 25  ALT 20  ALKPHOS 70   BILITOT 1.5*  PROT 7.6  ALBUMIN 3.8   No results for input(s): "LIPASE", "AMYLASE" in the last 168 hours. No results for input(s): "AMMONIA" in the last 168 hours. Coagulation Profile: Recent Labs  Lab 04/07/23 1145  INR 1.1   Cardiac Enzymes: No results for input(s): "CKTOTAL", "CKMB", "CKMBINDEX", "TROPONINI" in the last 168 hours. BNP (last 3 results) No results for input(s): "PROBNP" in the last 8760 hours. HbA1C: No results for input(s): "HGBA1C" in the last 72 hours. CBG: No results for input(s): "GLUCAP" in the last 168 hours. Lipid Profile: No results for input(s): "CHOL", "HDL", "LDLCALC", "TRIG", "CHOLHDL", "LDLDIRECT" in the last 72 hours. Thyroid Function Tests: No results for input(s): "TSH", "T4TOTAL", "FREET4", "T3FREE", "THYROIDAB" in the last 72 hours. Anemia Panel: No results for input(s): "VITAMINB12", "FOLATE", "FERRITIN", "TIBC", "IRON", "RETICCTPCT" in the last 72 hours. Urine analysis: No results found for: "COLORURINE", "APPEARANCEUR", "LABSPEC", "PHURINE", "GLUCOSEU", "HGBUR", "BILIRUBINUR", "KETONESUR", "PROTEINUR", "UROBILINOGEN", "NITRITE", "LEUKOCYTESUR"  Radiological Exams on Admission: DG Chest 2 View  Result Date: 04/07/2023 CLINICAL DATA:  Shortness of breath over the last 5 days. Poss small sepsis. EXAM: CHEST - 2 VIEW COMPARISON:  09/01/2016 FINDINGS: Widespread bronchial thickening suggesting bronchitis. No consolidation, collapse or effusion. No abnormal bone finding. IMPRESSION: Bronchitis pattern. No consolidation or collapse. Electronically Signed   By: Paulina Fusi M.D.   On: 04/07/2023 11:45    EKG: Independently reviewed. Sinus tachycardia   Assessment/Plan Principal Problem:   COPD with acute exacerbation (HCC) Active Problems:   Acute respiratory failure with hypoxia (HCC)   Tobacco use disorder   COPD (chronic obstructive pulmonary disease) (HCC)   Cough in adult   Chest congestion   Chronic bronchitis (HCC)   Reactive  airway disease   Wheezing   Hypokalemia   Leukocytosis   Polycythemia secondary to smoking   Acute respiratory failure with hypoxia Acute COPD exacerbation  - likely an exacerbation of chronic bronchitis from smoking - failed outpatient management - new oxygen requirement  - continue IV steroids as ordered - continue scheduled bronchodilators - added mucinex 1200 mg BID - delsym cough syrup BID prn  - received IV ceftriaxone and azithromycin in ED on 5/27 - start oral doxycycline 100 mg BID starting 5/28 - wean oxygen as tolerated  - home O2 evaluation in AM with PT  - added flutter valve  - check Mg and supplement as needed  - pantoprazole 40 mg daily for GI protection from high dose steroids  Tobacco  - counseled on cessation of all tobacco  - nicotine patch ordered if needed for nicotine craving symptoms  Hypokalemia  - oral replacement ordered - follow up mg level  - recheck BMP in AM   Leukocytosis  - possibly a leukemoid reaction from IM steroid dose given at urgent care on 5/8  Polycythemia secondary to smoking - recheck in AM  - tobacco cessation strongly advised   Sinus tachycardia  -  reaction from hour long continuous albuterol nebulizer treatment given in ED  DVT prophylaxis: enoxaparin   Code Status: Full   Family Communication:  pt declined my offer to call son in [place] on 5/27 Disposition Plan: anticipate DC home in 24 hours   Consults called:   Admission status: OB  Level of care: Med-Surg Standley Dakins MD [facility] How to contact the [facility] Attending or Consulting provider 7A - 7P or covering provider during after hours 7P -7A, for this patient?  Check the care team in [facility] and look for a) attending/consulting [facility] provider listed and b) the [facility] team listed Log into [email or web address] and use [facility] universal password to access. If you do not have the password, please contact the hospital operator. Locate the [facility] provider you are looking for  under [facility] and page to a number that you can be directly reached. If you still have difficulty reaching the provider, please page the [facility] (Director on Call) for the Hospitalists listed on amion for assistance.   If 7PM-7AM, please contact night-coverage [email or web address] Password [facility]  04/07/2023, 3:48 PM

## 2023-04-08 DIAGNOSIS — J441 Chronic obstructive pulmonary disease with (acute) exacerbation: Secondary | ICD-10-CM | POA: Diagnosis not present

## 2023-04-08 LAB — CBC WITH DIFFERENTIAL/PLATELET
Abs Immature Granulocytes: 0.07 10*3/uL (ref 0.00–0.07)
Basophils Absolute: 0 10*3/uL (ref 0.0–0.1)
Basophils Relative: 0 %
Eosinophils Absolute: 0 10*3/uL (ref 0.0–0.5)
Eosinophils Relative: 0 %
HCT: 40.7 % (ref 36.0–46.0)
Hemoglobin: 14 g/dL (ref 12.0–15.0)
Immature Granulocytes: 1 %
Lymphocytes Relative: 7 %
Lymphs Abs: 0.8 10*3/uL (ref 0.7–4.0)
MCH: 32.1 pg (ref 26.0–34.0)
MCHC: 34.4 g/dL (ref 30.0–36.0)
MCV: 93.3 fL (ref 80.0–100.0)
Monocytes Absolute: 0.2 10*3/uL (ref 0.1–1.0)
Monocytes Relative: 2 %
Neutro Abs: 10.2 10*3/uL — ABNORMAL HIGH (ref 1.7–7.7)
Neutrophils Relative %: 90 %
Platelets: 146 10*3/uL — ABNORMAL LOW (ref 150–400)
RBC: 4.36 MIL/uL (ref 3.87–5.11)
RDW: 12.7 % (ref 11.5–15.5)
WBC: 11.3 10*3/uL — ABNORMAL HIGH (ref 4.0–10.5)
nRBC: 0 % (ref 0.0–0.2)

## 2023-04-08 LAB — CULTURE, BLOOD (ROUTINE X 2)

## 2023-04-08 LAB — BASIC METABOLIC PANEL
Anion gap: 14 (ref 5–15)
BUN: 17 mg/dL (ref 8–23)
CO2: 20 mmol/L — ABNORMAL LOW (ref 22–32)
Calcium: 8.6 mg/dL — ABNORMAL LOW (ref 8.9–10.3)
Chloride: 102 mmol/L (ref 98–111)
Creatinine, Ser: 0.64 mg/dL (ref 0.44–1.00)
GFR, Estimated: >60 mL/min (ref >60)
Glucose, Bld: 155 mg/dL — ABNORMAL HIGH (ref 70–99)
Potassium: 3.7 mmol/L (ref 3.5–5.1)
Sodium: 136 mmol/L (ref 135–145)

## 2023-04-08 LAB — HIV ANTIBODY (ROUTINE TESTING W REFLEX): HIV Screen 4th Generation wRfx: NONREACTIVE

## 2023-04-08 LAB — MAGNESIUM: Magnesium: 2.3 mg/dL (ref 1.7–2.4)

## 2023-04-08 MED ORDER — PREDNISONE 20 MG PO TABS: 40.0000 mg | ORAL_TABLET | Freq: Every day | ORAL | 0 refills | Status: AC

## 2023-04-08 MED ORDER — ALBUTEROL SULFATE (2.5 MG/3ML) 0.083% IN NEBU: 2.5000 mg | INHALATION_SOLUTION | RESPIRATORY_TRACT | 2 refills | Status: AC | PRN

## 2023-04-08 MED ORDER — ALBUTEROL SULFATE HFA 108 (90 BASE) MCG/ACT IN AERS: 2.0000 | INHALATION_SPRAY | Freq: Four times a day (QID) | RESPIRATORY_TRACT | 2 refills | Status: AC | PRN

## 2023-04-08 MED ORDER — GUAIFENESIN ER 600 MG PO TB12: 600.0000 mg | ORAL_TABLET | Freq: Two times a day (BID) | ORAL | 0 refills | Status: AC

## 2023-04-08 MED ORDER — AZITHROMYCIN 500 MG PO TABS: 500.0000 mg | ORAL_TABLET | Freq: Every day | ORAL | 0 refills | Status: AC

## 2023-04-08 MED ORDER — NICOTINE 21 MG/24HR TD PT24: 21.0000 mg | MEDICATED_PATCH | TRANSDERMAL | 0 refills | Status: AC

## 2023-04-08 NOTE — Progress Notes (Addendum)
SATURATION QUALIFICATIONS: (This note is used to comply with regulatory documentation for home oxygen)  Patient Saturations on Room Air at Rest = 96%  Patient Saturations on Room Air while Ambulating = 93%  

## 2023-04-08 NOTE — Evaluation (Signed)
Physical Therapy Evaluation Patient Details Name: Cindy Griffith MRN: [id number] DOB: [birth year] Today's Date: 04/08/2023  History of Present Illness  Cindy Griffith is a 62 year old female long-time smoker with not a lot of prior medical history and on no regular medications had been traveling to [place] to visit family when she started developing cough, wheezing and shortness of breath about 5 days ago.  She ended up using her grand-daughter's albuterol nebulizer when she started having a severe wheezing and shortness of breath episode.  That seemed to relieve her symptoms so that she was able to fly back home.  She waiting until Tues 5/28 to go to her trusted urgent care.  They treated her with an IM steroid injection and nebulizer treatment and sent her to the ED with concern for new oxygen requirement.  She was given additional nebulizer treatment in the ED and she started to show some improvement however when she was ambulated her oxygen saturation dipped to 88% on room air and she became tachycardic and short of breath.  Admission was requested for a longer course of treatment.  Pt says that she has had "bronchitis" multiple times in the past but has never been this severe and she has never required hospitalization for this in the past.  Her chest xray shows bronchitis findings.  With her long smoking history she is being treated for presumptive acute exacerbation of COPD. She was given a dose of ceftriaxone and azithromycin in the ED in addition to a continuous hour-long albuterol nebulizer treatment.   Clinical Impression  Patient functioning near baseline for functional mobility and gait other than mild drop in SpO2 during ambulation from 93% to 88% while on room air during ambulation, once seated SpO2 increased to 93%.  Plan:  Patient discharged from physical therapy to care of nursing for ambulation daily as tolerated for length of stay.         Recommendations for follow up therapy are one component  of a multi-disciplinary discharge planning process, led by the attending physician.  Recommendations may be updated based on patient status, additional functional criteria and insurance authorization.  Follow Up Recommendations       Assistance Recommended at Discharge PRN  Patient can return home with the following  Other (comment) (near baseline)    Equipment Recommendations None recommended by PT  Recommendations for Other Services       Functional Status Assessment Patient has not had a recent decline in their functional status     Precautions / Restrictions Precautions Precautions: None Restrictions Weight Bearing Restrictions: No      Mobility  Bed Mobility Overal bed mobility: Independent                  Transfers Overall transfer level: Modified independent                      Ambulation/Gait Ambulation/Gait assistance: Modified independent (Device/Increase time) Gait Distance (Feet): 150 Feet Assistive device: None Gait Pattern/deviations: WFL(Within Functional Limits) Gait velocity: slightly decreased     General Gait Details: grossly WFL with good return for ambulating in room/hallways without loss of  balance, on room air with SpO2 at 88-93%  Stairs            Wheelchair Mobility    Modified Rankin (Stroke Patients Only)       Balance Overall balance assessment: No apparent balance deficits (not formally assessed)  Pertinent Vitals/Pain Pain Assessment Pain Assessment: No/denies pain    Home Living Family/patient expects to be discharged to:: Private residence Living Arrangements: Alone Available Help at Discharge: Friend(s);Available 24 hours/day Type of Home: Mobile home Home Access: Level entry       Home Layout: One level Home Equipment: Cane - single point      Prior Function Prior Level of Function : Independent/Modified Independent;Driving              Mobility Comments: Community ambulator without AD, drives ADLs Comments: Independent     Hand Dominance        Extremity/Trunk Assessment   Upper Extremity Assessment Upper Extremity Assessment: Overall WFL for tasks assessed    Lower Extremity Assessment Lower Extremity Assessment: Overall WFL for tasks assessed    Cervical / Trunk Assessment Cervical / Trunk Assessment: Normal  Communication   Communication: No difficulties  Cognition Arousal/Alertness: Awake/alert Behavior During Therapy: WFL for tasks assessed/performed Overall Cognitive Status: Within Functional Limits for tasks assessed                                          General Comments      Exercises     Assessment/Plan    PT Assessment Patient does not need any further PT services  PT Problem List         PT Treatment Interventions      PT Goals (Current goals can be found in the Care Plan section)  Acute Rehab PT Goals Patient Stated Goal: return home with friend to assist PT Goal Formulation: With patient Time For Goal Achievement: 04/08/23 Potential to Achieve Goals: Good    Frequency       Co-evaluation               AM-PAC PT "6 Clicks" Mobility  Outcome Measure Help needed turning from your back to your side while in a flat bed without using bedrails?: None Help needed moving from lying on your back to sitting on the side of a flat bed without using bedrails?: None Help needed moving to and from a bed to a chair (including a wheelchair)?: None Help needed standing up from a chair using your arms (e.g., wheelchair or bedside chair)?: None Help needed to walk in hospital room?: None Help needed climbing 3-5 steps with a railing? : A Little 6 Click Score: 23    End of Session   Activity Tolerance: Patient tolerated treatment well Patient left: in bed;with call bell/phone within reach;with family/visitor present Nurse Communication: Mobility  status PT Visit Diagnosis: Unsteadiness on feet (R26.81);Other abnormalities of gait and mobility (R26.89);Muscle weakness (generalized) (M62.81)    Time: 1125-1140 PT Time Calculation (min) (ACUTE ONLY): 15 min   Charges:   PT Evaluation $PT Eval Low Complexity: 1 Low PT Treatments $Therapeutic Activity: 8-22 mins        12:12 PM, 04/08/23 Ocie Bob, MPT Physical Therapist with [facility] [phone] office [phone] mobile phone

## 2023-04-08 NOTE — Progress Notes (Signed)
Patient slept through the night during this shift. No complaints of pain. 1 prn medications given. Patient up independently to the restroom. Continues to be on 2 liters of oxygen via nasal cannula with O2 sat of 93 percent.

## 2023-04-08 NOTE — TOC CM/SW Note (Signed)
Transition of Care [facility]) - Inpatient Brief Assessment   Patient Details  Name: Cindy Griffith MRN: [id number] Date of Birth: 1960/12/10  Transition of Care [facility]) CM/SW Contact:    Villa Herb, LCSWA Phone Number: 04/08/2023, 11:36 AM   Clinical Narrative: Transition of Care Department [facility]) has reviewed patient and no TOC needs have been identified at this time. We will continue to monitor patient advancement through interdisciplinary progression rounds. If new patient transition needs arise, please place a TOC consult.  Transition of Care Asessment: Insurance and Status: Selfpay Patient has primary care physician: Yes Home environment has been reviewed: home alone Prior level of function:: independent Prior/Current Home Services: No current home services Social Determinants of Health Reivew: SDOH reviewed no interventions necessary Readmission risk has been reviewed: Yes Transition of care needs: no transition of care needs at this time

## 2023-04-08 NOTE — Discharge Instructions (Signed)
1) complete abstinence from tobacco/smoking cessation advised-- 2) please take medications as prescribed 3) please follow-up with PCP for recheck within a week

## 2023-04-08 NOTE — Discharge Summary (Signed)
Cindy Griffith, is a 62 y.o. female  DOB 04/19/1961  MRN [id number].  Admission date:  04/07/2023  Admitting Physician  Cleora Fleet, MD  Discharge Date:  04/08/2023   Primary MD  [facility]  Recommendations for primary care physician for things to follow:   1) complete abstinence from tobacco/smoking cessation advised-- 2) please take medications as prescribed 3) please follow-up with PCP for recheck within a week  Admission Diagnosis  COPD with acute exacerbation (HCC) [J44.1]   Discharge Diagnosis  COPD with acute exacerbation (HCC) [J44.1]    Principal Problem:   COPD with acute exacerbation (HCC) Active Problems:   Acute respiratory failure with hypoxia (HCC)   Tobacco use disorder   COPD (chronic obstructive pulmonary disease) (HCC)   Cough in adult   Chest congestion   Chronic bronchitis (HCC)   Reactive airway disease   Wheezing   Hypokalemia   Leukocytosis   Polycythemia secondary to smoking      Past Medical History:  Diagnosis Date   Bronchitis     Past Surgical History:  Procedure Laterality Date   CESAREAN SECTION     CHOLECYSTECTOMY      HPI  from the history and physical done on the day of admission:   Chief Complaint: SOB   HPI: Cindy Griffith is a 63 year old female long-time smoker with not a lot of prior medical history and on no regular medications had been traveling to [place] to visit family when she started developing cough, wheezing and shortness of breath about 5 days ago.  She ended up using her grand-daughter's albuterol nebulizer when she started having a severe wheezing and shortness of breath episode.  That seemed to relieve her symptoms so that she was able to fly back home.  She waiting until Tues 5/28 to go to her trusted urgent care.  They treated her with an IM steroid injection and nebulizer treatment and sent her to the ED  with concern for new oxygen requirement.  She was given additional nebulizer treatment in the ED and she started to show some improvement however when she was ambulated her oxygen saturation dipped to 88% on room air and she became tachycardic and short of breath.  Admission was requested for a longer course of treatment.  Pt says that she has had "bronchitis" multiple times in the past but has never been this severe and she has never required hospitalization for this in the past.  Her chest xray shows bronchitis findings.  With her long smoking history she is being treated for presumptive acute exacerbation of COPD. She was given a dose of ceftriaxone and azithromycin in the ED in addition to a continuous hour-long albuterol nebulizer treatment.    Hospital Course:   1)Acute COPD Exacerbation- no definite pneumonia,   -treated with empirically with IV Solu-Medrol -Also received mucolytics, Rocephin and azithromycin  and bronchodilators  -Weaned off oxygen -Respiratory status improved significantly -Blood cultures negative, respiratory panel negative -= Smoking cessation strongly  advised okay to use OTC nicotine patch -Okay to discharge on p.o. azithromycin, prednisone mucolytics and bronchodilators -Leukocytosis may persist due to steroids -Patient is afebrile  2) tobacco abuse--smoking cessation advised as above, nicotine patch advised as above  3) acute hypoxic respiratory failure--- due to #1 above -Resolved with treatment of #1 above -Post ambulation O2 sats 93 to 96% on room air at this time  Discharge Condition: Stable without hypoxia  Follow UP   Follow-up Information     [facility] Schedule an appointment as soon as possible for a visit in 1 week(s).   Contact information: [address] [place] [address] [phone]                 Diet and Activity recommendation:  As advised  Discharge Instructions   * Discharge Instructions      Call MD for:  difficulty breathing, headache or visual disturbances   Complete by: As directed    Call MD for:  persistant dizziness or light-headedness   Complete by: As directed    Call MD for:  persistant nausea and vomiting   Complete by: As directed    Call MD for:  temperature >100.4   Complete by: As directed    Diet - low sodium heart healthy   Complete by: As directed    Discharge instructions   Complete by: As directed    1) complete abstinence from tobacco/smoking cessation advised-- 2) please take medications as prescribed 3) please follow-up with PCP for recheck within a week   Increase activity slowly   Complete by: As directed          Discharge Medications     Allergies as of 04/08/2023       Reactions   Codeine Nausea And Vomiting        Medication List     TAKE these medications    albuterol 108 (90 Base) MCG/ACT inhaler Commonly known as: VENTOLIN HFA Inhale 2 puffs into the lungs every 6 (six) hours as needed for wheezing or shortness of breath.   albuterol (2.5 MG/3ML) 0.083% nebulizer solution Commonly known as: PROVENTIL Take 3 mLs (2.5 mg total) by nebulization every 4 (four) hours as needed for wheezing or shortness of breath.   azithromycin 500 MG tablet Commonly known as: ZITHROMAX Take 1 tablet (500 mg total) by mouth daily for 5 days.   guaiFENesin 600 MG 12 hr tablet Commonly known as: Mucinex Take 1 tablet (600 mg total) by mouth 2 (two) times daily for 10 days.   nicotine 21 mg/24hr patch Commonly known as: NICODERM CQ - dosed in mg/24 hours Place 1 patch (21 mg total) onto the skin daily for 28 days.   predniSONE 20 MG tablet Commonly known as: DELTASONE Take 2 tablets (40 mg total) by mouth daily with breakfast for 5 days.        Major procedures and Radiology Reports - PLEASE review detailed and final reports for all details, in brief -    DG Chest 2 View  Result Date: 04/07/2023 CLINICAL DATA:  Shortness of  breath over the last 5 days. Poss small sepsis. EXAM: CHEST - 2 VIEW COMPARISON:  09/01/2016 FINDINGS: Widespread bronchial thickening suggesting bronchitis. No consolidation, collapse or effusion. No abnormal bone finding. IMPRESSION: Bronchitis pattern. No consolidation or collapse. Electronically Signed   By: Paulina Fusi M.D.   On: 04/07/2023 11:45    Micro Results   Recent Results (from the past  240 hour(s))  Resp panel by RT-PCR (RSV, Flu A&B, Covid) Anterior Nasal Swab     Status: None   Collection Time: 04/07/23 10:55 AM   Specimen: Anterior Nasal Swab  Result Value Ref Range Status   SARS Coronavirus 2 by RT PCR NEGATIVE NEGATIVE Final    Comment: (NOTE) SARS-CoV-2 target nucleic acids are NOT DETECTED.  The SARS-CoV-2 RNA is generally detectable in upper respiratory specimens during the acute phase of infection. The lowest concentration of SARS-CoV-2 viral copies this assay can detect is 138 copies/mL. A negative result does not preclude SARS-Cov-2 infection and should not be used as the sole basis for treatment or other patient management decisions. A negative result may occur with  improper specimen collection/handling, submission of specimen other than nasopharyngeal swab, presence of viral mutation(s) within the areas targeted by this assay, and inadequate number of viral copies(<138 copies/mL). A negative result must be combined with clinical observations, patient history, and epidemiological information. The expected result is Negative.  Fact Sheet for Patients:  BloggerCourse.com  Fact Sheet for Healthcare Providers:  SeriousBroker.it  This test is no t yet approved or cleared by the [place] FDA and  has been authorized for detection and/or diagnosis of SARS-CoV-2 by FDA under an Emergency Use Authorization (EUA). This EUA will remain  in effect (meaning this test can be used) for the duration of the COVID-19  declaration under Section 564(b)(1) of the Act, 21 U.S.C.section 360bbb-3(b)(1), unless the authorization is terminated  or revoked sooner.       Influenza A by PCR NEGATIVE NEGATIVE Final   Influenza B by PCR NEGATIVE NEGATIVE Final    Comment: (NOTE) The Xpert Xpress SARS-CoV-2/FLU/RSV plus assay is intended as an aid in the diagnosis of influenza from Nasopharyngeal swab specimens and should not be used as a sole basis for treatment. Nasal washings and aspirates are unacceptable for Xpert Xpress SARS-CoV-2/FLU/RSV testing.  Fact Sheet for Patients: BloggerCourse.com  Fact Sheet for Healthcare Providers: SeriousBroker.it  This test is not yet approved or cleared by the [place] FDA and has been authorized for detection and/or diagnosis of SARS-CoV-2 by FDA under an Emergency Use Authorization (EUA). This EUA will remain in effect (meaning this test can be used) for the duration of the COVID-19 declaration under Section 564(b)(1) of the Act, 21 U.S.C. section 360bbb-3(b)(1), unless the authorization is terminated or revoked.     Resp Syncytial Virus by PCR NEGATIVE NEGATIVE Final    Comment: (NOTE) Fact Sheet for Patients: BloggerCourse.com  Fact Sheet for Healthcare Providers: SeriousBroker.it  This test is not yet approved or cleared by the [place] FDA and has been authorized for detection and/or diagnosis of SARS-CoV-2 by FDA under an Emergency Use Authorization (EUA). This EUA will remain in effect (meaning this test can be used) for the duration of the COVID-19 declaration under Section 564(b)(1) of the Act, 21 U.S.C. section 360bbb-3(b)(1), unless the authorization is terminated or revoked.  Performed at [facility], [address], [place], [place] [address]   Culture, blood (Routine x 2)     Status: None (Preliminary result)   Collection Time: 04/07/23  11:43 AM   Specimen: Left Antecubital; Blood  Result Value Ref Range Status   Specimen Description LEFT ANTECUBITAL  Final   Special Requests   Final    BOTTLES DRAWN AEROBIC AND ANAEROBIC Blood Culture adequate volume   Culture   Final    NO GROWTH < 24 HOURS Performed at [facility], [address] [place], [place] [address]    Report Status PENDING  Incomplete  Culture, blood (Routine x 2)     Status: None (Preliminary result)   Collection Time: 04/07/23 11:47 AM   Specimen: Right Antecubital; Blood  Result Value Ref Range Status   Specimen Description RIGHT ANTECUBITAL  Final   Special Requests   Final    BLOOD BOTTLES DRAWN AEROBIC AND ANAEROBIC Blood Culture adequate volume   Culture   Final    NO GROWTH < 24 HOURS Performed at [facility], [address] [place], [place] [address]    Report Status PENDING  Incomplete    Today   Subjective    Cindy Griffith today has no new complaints -Cough and dyspnea has improved -Hypoxia has resolved    --post ambulation O2 sats on room air is 93 to 96%      Patient has been seen and examined prior to discharge   Objective   Blood pressure 132/66, pulse 69, temperature 97.6 F (36.4 C), temperature source Oral, resp. rate 18, SpO2 94 %.   Intake/Output Summary (Last 24 hours) at 04/08/2023 1132 Last data filed at 04/08/2023 2595 Gross per 24 hour  Intake 1036.22 ml  Output --  Net 1036.22 ml    Exam Gen:- Awake Alert, no acute distress , no conversational dyspnea no significant dyspnea on exertion HEENT:- [place].AT, No sclera icterus Neck-Supple Neck,No JVD,.  Lungs-improved air movement, no wheezing CV- S1, S2 normal, regular Abd-  +ve B.Sounds, Abd Soft, No tenderness,    Extremity/Skin:- No  edema,   good pulses Psych-affect is appropriate, oriented x3 Neuro-no new focal deficits, no tremors    Data Review   CBC w Diff:  Lab Results  Component Value Date   WBC 11.3 (H) 04/08/2023   HGB 14.0 04/08/2023   HCT  40.7 04/08/2023   PLT 146 (L) 04/08/2023   LYMPHOPCT 7 04/08/2023   MONOPCT 2 04/08/2023   EOSPCT 0 04/08/2023   BASOPCT 0 04/08/2023    CMP:  Lab Results  Component Value Date   NA 136 04/08/2023   K 3.7 04/08/2023   CL 102 04/08/2023   CO2 20 (L) 04/08/2023   BUN 17 04/08/2023   CREATININE 0.64 04/08/2023   PROT 7.6 04/07/2023   ALBUMIN 3.8 04/07/2023   BILITOT 1.5 (H) 04/07/2023   ALKPHOS 70 04/07/2023   AST 25 04/07/2023   ALT 20 04/07/2023  .  Total Discharge time is about 33 minutes  Shon Hale M.D on 04/08/2023 at 11:32 AM  Go to [email or web address] -  for contact info  [facility] - Office  [phone]

## 2023-04-09 LAB — CULTURE, BLOOD (ROUTINE X 2): Culture: NO GROWTH

## 2023-04-10 LAB — CULTURE, BLOOD (ROUTINE X 2)
Culture: NO GROWTH
Special Requests: ADEQUATE

## 2023-04-12 LAB — CULTURE, BLOOD (ROUTINE X 2)

## 2023-04-13 ENCOUNTER — Emergency Department (HOSPITAL_COMMUNITY): Payer: Medicaid Other

## 2023-04-13 ENCOUNTER — Encounter (HOSPITAL_COMMUNITY): Payer: Self-pay | Admitting: Emergency Medicine

## 2023-04-13 ENCOUNTER — Emergency Department (HOSPITAL_COMMUNITY)
Admission: EM | Admit: 2023-04-13 | Discharge: 2023-04-13 | Disposition: A | Discharge status: Home / Self Care | Payer: Medicaid Other | Attending: Emergency Medicine | Admitting: Emergency Medicine

## 2023-04-13 ENCOUNTER — Other Ambulatory Visit: Payer: Self-pay

## 2023-04-13 DIAGNOSIS — J449 Chronic obstructive pulmonary disease, unspecified: Secondary | ICD-10-CM | POA: Diagnosis not present

## 2023-04-13 DIAGNOSIS — R0602 Shortness of breath: Secondary | ICD-10-CM | POA: Diagnosis present

## 2023-04-13 DIAGNOSIS — R0600 Dyspnea, unspecified: Secondary | ICD-10-CM | POA: Diagnosis not present

## 2023-04-13 LAB — CBC
HCT: 46.5 % — ABNORMAL HIGH (ref 36.0–46.0)
Hemoglobin: 16 g/dL — ABNORMAL HIGH (ref 12.0–15.0)
MCH: 31.8 pg (ref 26.0–34.0)
MCHC: 34.4 g/dL (ref 30.0–36.0)
MCV: 92.4 fL (ref 80.0–100.0)
Platelets: 272 10*3/uL (ref 150–400)
RBC: 5.03 MIL/uL (ref 3.87–5.11)
RDW: 12.9 % (ref 11.5–15.5)
WBC: 7.8 10*3/uL (ref 4.0–10.5)
nRBC: 0 % (ref 0.0–0.2)

## 2023-04-13 LAB — BASIC METABOLIC PANEL
Anion gap: 11 (ref 5–15)
BUN: 11 mg/dL (ref 8–23)
CO2: 23 mmol/L (ref 22–32)
Calcium: 9 mg/dL (ref 8.9–10.3)
Chloride: 105 mmol/L (ref 98–111)
Creatinine, Ser: 0.85 mg/dL (ref 0.44–1.00)
GFR, Estimated: >60 mL/min (ref >60)
Glucose, Bld: 114 mg/dL — ABNORMAL HIGH (ref 70–99)
Potassium: 4 mmol/L (ref 3.5–5.1)
Sodium: 139 mmol/L (ref 135–145)

## 2023-04-13 MED ORDER — ALBUTEROL SULFATE (2.5 MG/3ML) 0.083% IN NEBU: 2.5000 mg | INHALATION_SOLUTION | RESPIRATORY_TRACT | Status: DC | PRN

## 2023-04-13 NOTE — ED Triage Notes (Signed)
Pt sent by PCP for reeval and possible admission due to continued SOB after recent hospital discharge for bronchitis treatment. She has been compliant with prescribed inhalers, abx, steroids, and mucinex without improvement to symptoms. O2 saturation 95% on room air in triage. Inspiratory wheeze noted in right lower lobe

## 2023-04-13 NOTE — ED Provider Notes (Signed)
[place] EMERGENCY DEPARTMENT AT [facility] Provider Note   CSN: [id number] Arrival date & time: 04/13/23  1423     History  Chief Complaint  Patient presents with   Shortness of Breath    Shylin Venezia is a 62 y.o. female.   Shortness of Breath Patient with history of COPD and bronchitis.  Smoker.  Discharged home after bronchitis exacerbation around 5 days ago.  States she was short of breath when she left.  States she continues to be about the same in terms of her shortness of breath.  Has 5 days of Mucinex with left but states she just finished up her steroids and antibiotics today.  Went to see PCP who was told she needed to come to the hospital and may require admission in the hospital and IV antibiotics. Has had a cough with some mild sputum production.  Unchanged.  Does use inhalers at home.  States does feel short of breath but has not worsened since she left.  States that her oxygen saturations will go into the low 90s.    Past Medical History:  Diagnosis Date   Bronchitis     Home Medications Prior to Admission medications   Medication Sig Start Date End Date Taking? Authorizing Provider  albuterol (PROVENTIL) (2.5 MG/3ML) 0.083% nebulizer solution Take 3 mLs (2.5 mg total) by nebulization every 4 (four) hours as needed for wheezing or shortness of breath. 04/08/23 04/07/24  Shon Hale, MD  albuterol (VENTOLIN HFA) 108 (90 Base) MCG/ACT inhaler Inhale 2 puffs into the lungs every 6 (six) hours as needed for wheezing or shortness of breath. 04/08/23   Emokpae, Courage, MD  azithromycin (ZITHROMAX) 500 MG tablet Take 1 tablet (500 mg total) by mouth daily for 5 days. 04/08/23 04/13/23  Shon Hale, MD  guaiFENesin (MUCINEX) 600 MG 12 hr tablet Take 1 tablet (600 mg total) by mouth 2 (two) times daily for 10 days. 04/08/23 04/18/23  Shon Hale, MD  nicotine (NICODERM CQ - DOSED IN MG/24 HOURS) 21 mg/24hr patch Place 1 patch (21 mg total) onto the skin daily  for 28 days. 04/08/23 05/06/23  Shon Hale, MD  predniSONE (DELTASONE) 20 MG tablet Take 2 tablets (40 mg total) by mouth daily with breakfast for 5 days. 04/08/23 04/13/23  Shon Hale, MD      Allergies    Codeine    Review of Systems   Review of Systems  Respiratory:  Positive for shortness of breath.     Physical Exam Updated Vital Signs BP (!) 149/75   Pulse 66   Temp 98 F (36.7 C) (Oral)   Resp 18   Ht 5\' 2"  (1.575 m)   Wt 88 kg   SpO2 95%   BMI 35.48 kg/m  Physical Exam Vitals reviewed.  Cardiovascular:     Rate and Rhythm: Normal rate.  Pulmonary:     Comments: Mildly harsh breath sounds without localizing rales or rhonchi. Chest:     Chest wall: No tenderness.  Musculoskeletal:     Right lower leg: No edema.     Left lower leg: No edema.  Skin:    Capillary Refill: Capillary refill takes less than 2 seconds.     ED Results / Procedures / Treatments   Labs (all labs ordered are listed, but only abnormal results are displayed) Labs Reviewed  BASIC METABOLIC PANEL - Abnormal; Notable for the following components:      Result Value   Glucose, Bld 114 (*)  All other components within normal limits  CBC - Abnormal; Notable for the following components:   Hemoglobin 16.0 (*)    HCT 46.5 (*)    All other components within normal limits    EKG None  Radiology DG Chest 2 View  Result Date: 04/13/2023 CLINICAL DATA:  Shortness of breath. EXAM: CHEST - 2 VIEW COMPARISON:  04/07/2023. FINDINGS: Persistent bronchial wall thickening. No consolidation. Stable cardiac and mediastinal contours. No pleural effusion or pneumothorax. IMPRESSION: Persistent bronchial wall thickening which may be due to bronchitis or reactive airways disease. Electronically Signed   By: Orvan Falconer M.D.   On: 04/13/2023 14:49    Procedures Procedures    Medications Ordered in ED Medications - No data to display   ED Course/ Medical Decision Making/ A&P                              Medical Decision Making Amount and/or Complexity of Data Reviewed Labs: ordered. Radiology: ordered.  Risk Prescription drug management.   Patient with shortness of breath and cough.  Recent admission for COPD.  States short of breath when went home.  States that is not worsened but is not improved.  Saw PCP and reportedly had respirations in the 40s.  Sats however were 95%.  Resting sats of 95% here.  Mildly harsh breath sounds.  X-ray independently interpreted and shows bronchitis. Feeling better.  Eager to be discharged home.  Do not think we need antibiotics.  Do not think we need more steroids I think this is just continued recovery from recent admission.  Appears stable for discharge home.        Final Clinical Impression(s) / ED Diagnoses Final diagnoses:  Dyspnea, unspecified type    Rx / DC Orders ED Discharge Orders     None         Benjiman Core, MD 04/13/23 2355

## 2023-04-13 NOTE — ED Notes (Signed)
Pt maintained O2 of 92 to 95 while ambulating.

## 2023-04-13 NOTE — Discharge Instructions (Addendum)
Your workup was reassuring today.  X-ray does not show pneumonia.  Your white count is improved.  Without worsening wheezing I do not think we need more steroids at this time.  Follow-up with your doctor as needed.
